# Patient Record
Sex: Male | Born: 1935 | Hispanic: No | Smoking: Never smoker
Health system: Southern US, Community
[De-identification: ages and names within clinical notes are randomized; demographics above are authoritative.]

---

## 2014-08-22 ENCOUNTER — Encounter (HOSPITAL_COMMUNITY): Payer: Self-pay | Admitting: Emergency Medicine

## 2014-08-22 ENCOUNTER — Emergency Department (HOSPITAL_COMMUNITY): Payer: Medicare Other

## 2014-08-22 ENCOUNTER — Emergency Department (HOSPITAL_COMMUNITY)
Admission: EM | Admit: 2014-08-22 | Discharge: 2014-08-22 | Disposition: A | Discharge status: Home / Self Care | Payer: Medicare Other | Attending: Emergency Medicine | Admitting: Emergency Medicine

## 2014-08-22 DIAGNOSIS — N401 Enlarged prostate with lower urinary tract symptoms: Secondary | ICD-10-CM | POA: Insufficient documentation

## 2014-08-22 DIAGNOSIS — R350 Frequency of micturition: Secondary | ICD-10-CM | POA: Diagnosis present

## 2014-08-22 DIAGNOSIS — R011 Cardiac murmur, unspecified: Secondary | ICD-10-CM | POA: Insufficient documentation

## 2014-08-22 DIAGNOSIS — R300 Dysuria: Secondary | ICD-10-CM

## 2014-08-22 DIAGNOSIS — R339 Retention of urine, unspecified: Secondary | ICD-10-CM

## 2014-08-22 DIAGNOSIS — N400 Enlarged prostate without lower urinary tract symptoms: Secondary | ICD-10-CM

## 2014-08-22 DIAGNOSIS — K4090 Unilateral inguinal hernia, without obstruction or gangrene, not specified as recurrent: Secondary | ICD-10-CM | POA: Insufficient documentation

## 2014-08-22 LAB — URINALYSIS, ROUTINE W REFLEX MICROSCOPIC
BILIRUBIN URINE: NEGATIVE
Glucose, UA: NEGATIVE mg/dL
KETONES UR: NEGATIVE mg/dL
Leukocytes, UA: NEGATIVE
NITRITE: NEGATIVE
Protein, ur: NEGATIVE mg/dL
Specific Gravity, Urine: 1.016 (ref 1.005–1.030)
Urobilinogen, UA: 1 mg/dL (ref 0.0–1.0)
pH: 6 (ref 5.0–8.0)

## 2014-08-22 LAB — CBC WITH DIFFERENTIAL/PLATELET
BASOS PCT: 0 % (ref 0–1)
Basophils Absolute: 0 10*3/uL (ref 0.0–0.1)
Eosinophils Absolute: 0.2 10*3/uL (ref 0.0–0.7)
Eosinophils Relative: 3 % (ref 0–5)
HEMATOCRIT: 41.7 % (ref 39.0–52.0)
HEMOGLOBIN: 14 g/dL (ref 13.0–17.0)
LYMPHS ABS: 0.9 10*3/uL (ref 0.7–4.0)
Lymphocytes Relative: 13 % (ref 12–46)
MCH: 28.8 pg (ref 26.0–34.0)
MCHC: 33.6 g/dL (ref 30.0–36.0)
MCV: 85.8 fL (ref 78.0–100.0)
MONOS PCT: 6 % (ref 3–12)
Monocytes Absolute: 0.5 10*3/uL (ref 0.1–1.0)
Neutro Abs: 5.4 10*3/uL (ref 1.7–7.7)
Neutrophils Relative %: 78 % — ABNORMAL HIGH (ref 43–77)
Platelets: 178 10*3/uL (ref 150–400)
RBC: 4.86 MIL/uL (ref 4.22–5.81)
RDW: 12.8 % (ref 11.5–15.5)
WBC: 7 10*3/uL (ref 4.0–10.5)

## 2014-08-22 LAB — BASIC METABOLIC PANEL
ANION GAP: 8 (ref 5–15)
BUN: 13 mg/dL (ref 6–20)
CHLORIDE: 105 mmol/L (ref 101–111)
CO2: 22 mmol/L (ref 22–32)
Calcium: 9 mg/dL (ref 8.9–10.3)
Creatinine, Ser: 1.48 mg/dL — ABNORMAL HIGH (ref 0.61–1.24)
GFR, EST AFRICAN AMERICAN: 49 mL/min — AB (ref >60)
GFR, EST NON AFRICAN AMERICAN: 42 mL/min — AB (ref >60)
GLUCOSE: 106 mg/dL — AB (ref 65–99)
Potassium: 4 mmol/L (ref 3.5–5.1)
Sodium: 135 mmol/L (ref 135–145)

## 2014-08-22 LAB — URINE MICROSCOPIC-ADD ON

## 2014-08-22 MED ORDER — CIPROFLOXACIN HCL 500 MG PO TABS: 500.0000 mg | ORAL_TABLET | Freq: Two times a day (BID) | ORAL | Status: AC

## 2014-08-22 MED ORDER — HYDROCODONE-ACETAMINOPHEN 5-325 MG PO TABS: 1.0000 | ORAL_TABLET | ORAL | Status: AC | PRN

## 2014-08-22 MED ORDER — TAMSULOSIN HCL 0.4 MG PO CAPS: 0.4000 mg | ORAL_CAPSULE | Freq: Every day | ORAL | Status: AC

## 2014-08-22 MED ORDER — CIPROFLOXACIN HCL 500 MG PO TABS
500.0000 mg | ORAL_TABLET | Freq: Once | ORAL | Status: AC
  Administered 2014-08-22: 500 mg via ORAL
  Filled 2014-08-22: qty 1

## 2014-08-22 NOTE — ED Notes (Signed)
Bladder scan- 

## 2014-08-22 NOTE — ED Notes (Signed)
Pt. Stated, about every 15 -20 minutes I have to go to bathroom.

## 2014-08-22 NOTE — ED Notes (Signed)
Interpretor phone used to educate pt on foley care and follow up care with urologist.  Pt verbalized understanding.

## 2014-08-22 NOTE — ED Provider Notes (Signed)
CSN: [id number]     Arrival date & time 08/22/14  1212 History   First MD Initiated Contact with Patient 08/22/14 1356     Chief Complaint  Patient presents with  . Urinary Frequency     (Consider location/radiation/quality/duration/timing/severity/associated sxs/prior Treatment) HPI Over 2 weeks the patient has been developing increasing difficulty and pain with urination. He reports frequency and inability to pass urine. Scribe severe burning. Also pain in the suprapubic and flank region. No fevers or chills. He reports 2 years ago he had some difficulty urinating and got some kind of medication that helps. He reports he was up all night with these symptoms. He reports he has not had fever or vomiting. He states he is otherwise been well. History reviewed. No pertinent past medical history. History reviewed. No pertinent past surgical history. No family history on file. History  Substance Use Topics  . Smoking status: Never Smoker   . Smokeless tobacco: Not on file  . Alcohol Use: No    Review of Systems  10 Systems reviewed and are negative for acute change except as noted in the HPI.   Allergies  Review of patient's allergies indicates not on file.  Home Medications   Prior to Admission medications   Medication Sig Start Date End Date Taking? Authorizing Provider  ciprofloxacin (CIPRO) 500 MG tablet Take 1 tablet (500 mg total) by mouth 2 (two) times daily. One po bid x 7 days 08/22/14   Arby Barrette, MD  HYDROcodone-acetaminophen (NORCO/VICODIN) 5-325 MG per tablet Take 1-2 tablets by mouth every 4 (four) hours as needed for moderate pain or severe pain. 08/22/14   Arby Barrette, MD  tamsulosin (FLOMAX) 0.4 MG CAPS capsule Take 1 capsule (0.4 mg total) by mouth daily. 08/22/14   Arby Barrette, MD   BP 176/75 mmHg  Pulse 101  Temp(Src) 98 F (36.7 C) (Oral)  Resp 17  Ht 5\' 5"  (1.651 m)  Wt 122 lb 4 oz (55.452 kg)  BMI 20.34 kg/m2  SpO2 96% Physical Exam   Constitutional: He is oriented to person, place, and time. He appears well-developed and well-nourished.  HENT:  Head: Normocephalic and atraumatic.  Eyes: EOM are normal. Pupils are equal, round, and reactive to light.  Neck: Neck supple.  Cardiovascular: Normal rate, regular rhythm and intact distal pulses.   Murmur (2/6 systolic ejection murmur) heard. Pulmonary/Chest: Effort normal.  No respiratory distress. Fine rales at the bases.  Abdominal: Soft. Bowel sounds are normal. He exhibits distension. There is tenderness.  Patient has a large inguinal hernia on the right. Positive suprapubic tenderness to palpation. Positive flank pain to tenderness. Scrotum is not erythematous or edematous. Penis is normal in appearance without discharge or erythema.  Musculoskeletal: Normal range of motion. He exhibits no edema.  Neurological: He is alert and oriented to person, place, and time. He has normal strength. Coordination normal. GCS eye subscore is 4. GCS verbal subscore is 5. GCS motor subscore is 6.  Skin: Skin is warm, dry and intact.  Psychiatric: He has a normal mood and affect.    ED Course  Procedures (including critical care time) Labs Review Labs Reviewed  URINALYSIS, ROUTINE W REFLEX MICROSCOPIC (NOT AT [facility]) - Abnormal; Notable for the following:    Hgb urine dipstick MODERATE (*)    All other components within normal limits  URINE MICROSCOPIC-ADD ON - Abnormal; Notable for the following:    Squamous Epithelial / LPF FEW (*)    Bacteria, UA FEW (*)  All other components within normal limits  BASIC METABOLIC PANEL - Abnormal; Notable for the following:    Glucose, Bld 106 (*)    Creatinine, Ser 1.48 (*)    GFR calc non Af Amer 42 (*)    GFR calc Af Amer 49 (*)    All other components within normal limits  CBC WITH DIFFERENTIAL/PLATELET - Abnormal; Notable for the following:    Neutrophils Relative % 78 (*)    All other components within normal limits  URINE CULTURE     Imaging Review Ct Renal Stone Study  08/22/2014   CLINICAL DATA:  Flank pain, difficulty urinating.  EXAM: CT ABDOMEN AND PELVIS WITHOUT CONTRAST  TECHNIQUE: Multidetector CT imaging of the abdomen and pelvis was performed following the standard protocol without IV contrast.  COMPARISON:  None.  FINDINGS: The liver, spleen, pancreas, gallbladder, adrenal glands are normal. There is mild hydronephrosis bilaterally without focal discrete obstructing stone in the collecting systems. Kidneys are normal. There is no perinephric stranding. There is atherosclerosis of the abdominal aorta without aneurysmal dilatation. There is no abdominal lymphadenopathy.  There is no small bowel obstruction or diverticulitis. The appendix is normal.  Fluid-filled bladder is normal. The prostate gland is enlarged measuring 5.1 x 4.3 x 7.6 cm. There are bilateral inguinal herniation of mesenteric fat and bowel loops without evidence of bowel obstruction or incarceration.  Images of the lung bases demonstrate scarring of the posterior right lung base. Degenerative joint changes of the spine are noted.  IMPRESSION: Bilateral mild hydronephrosis. This could be secondary to bladder outlet obstruction due to enlarged prostate.  Bilateral inguinal herniation of mesenteric fat and bowel loops without evidence of bowel obstruction or incarceration.   Electronically Signed   By: Sherian Rein M.D.   On: 08/22/2014 15:38     EKG Interpretation None      MDM   Final diagnoses:  Urinary retention  Dysuria  Prostatic hypertrophy   Patient has had dysuria and urgency with inability to empty his bladder. At this point he does have urinary retention and hematuria. CT does not show presence of kidney stone or other pathology other than prostatic hypertrophy and mild hydronephrosis. The patient will be discharged with a Foley catheter in place to continue bladder drainage and treated with ciprofloxacin. He is also given Flomax and  Vicodin for pain. Patient is aware of the need for urologic follow-up and the resources provided. He is otherwise well in appearance today, alert, clear mental status, no respiratory distress and nontoxic in appearance.    Arby Barrette, MD 08/22/14 1640

## 2014-08-22 NOTE — Discharge Instructions (Signed)
Acute Urinary Retention Acute urinary retention is the temporary inability to urinate. This is a common problem in older men. As men age their prostates become larger and block the flow of urine from the bladder. This is usually a problem that has come on gradually.  HOME CARE INSTRUCTIONS If you are sent home with a Foley catheter and a drainage system, you will need to discuss the best course of action with your health care provider. While the catheter is in, maintain a good intake of fluids. Keep the drainage bag emptied and lower than your catheter. This is so that contaminated urine will not flow back into your bladder, which could lead to a urinary tract infection. There are two main types of drainage bags. One is a large bag that usually is used at night. It has a good capacity that will allow you to sleep through the night without having to empty it. The second type is called a leg bag. It has a smaller capacity, so it needs to be emptied more frequently. However, the main advantage is that it can be attached by a leg strap and can go underneath your clothing, allowing you the freedom to move about or leave your home. Only take over-the-counter or prescription medicines for pain, discomfort, or fever as directed by your health care provider.  SEEK MEDICAL CARE IF:  You develop a low-grade fever.  You experience spasms or leakage of urine with the spasms. SEEK IMMEDIATE MEDICAL CARE IF:   You develop chills or fever.  Your catheter stops draining urine.  Your catheter falls out.  You start to develop increased bleeding that does not respond to rest and increased fluid intake. MAKE SURE YOU:  Understand these instructions.  Will watch your condition.  Will get help right away if you are not doing well or get worse. Document Released: 04/19/2000 Document Revised: 01/16/2013 Document Reviewed: 06/22/2012 [facility] Patient Information 2015 [place], [place]. This information is not  intended to replace advice given to you by your health care provider. Make sure you discuss any questions you have with your health care provider.  Dysuria Dysuria is the medical term for pain with urination. There are many causes for dysuria, but urinary tract infection is the most common. If a urinalysis was performed it can show that there is a urinary tract infection. A urine culture confirms that you or your child is sick. You will need to follow up with a healthcare provider because:  If a urine culture was done you will need to know the culture results and treatment recommendations.  If the urine culture was positive, you or your child will need to be put on antibiotics or know if the antibiotics prescribed are the right antibiotics for your urinary tract infection.  If the urine culture is negative (no urinary tract infection), then other causes may need to be explored or antibiotics need to be stopped. Today laboratory work may have been done and there does not seem to be an infection. If cultures were done they will take at least 24 to 48 hours to be completed. Today x-rays may have been taken and they read as normal. No cause can be found for the problems. The x-rays may be re-read by a radiologist and you will be contacted if additional findings are made. You or your child may have been put on medications to help with this problem until you can see your primary caregiver. If the problems get better, see your primary caregiver  if the problems return. If you were given antibiotics (medications which kill germs), take all of the mediations as directed for the full course of treatment.  If laboratory work was done, you need to find the results. Leave a telephone number where you can be reached. If this is not possible, make sure you find out how you are to get test results. HOME CARE INSTRUCTIONS   Drink lots of fluids. For adults, drink eight, 8 ounce glasses of clear juice or water a day.  For children, replace fluids as suggested by your caregiver.  Empty the bladder often. Avoid holding urine for long periods of time.  After a bowel movement, women should cleanse front to back, using each tissue only once.  Empty your bladder before and after sexual intercourse.  Take all the medicine given to you until it is gone. You may feel better in a few days, but TAKE ALL MEDICINE.  Avoid caffeine, tea, alcohol and carbonated beverages, because they tend to irritate the bladder.  In men, alcohol may irritate the prostate.  Only take over-the-counter or prescription medicines for pain, discomfort, or fever as directed by your caregiver.  If your caregiver has given you a follow-up appointment, it is very important to keep that appointment. Not keeping the appointment could result in a chronic or permanent injury, pain, and disability. If there is any problem keeping the appointment, you must call back to this facility for assistance. SEEK IMMEDIATE MEDICAL CARE IF:   Back pain develops.  A fever develops.  There is nausea (feeling sick to your stomach) or vomiting (throwing up).  Problems are no better with medications or are getting worse. MAKE SURE YOU:   Understand these instructions.  Will watch your condition.  Will get help right away if you are not doing well or get worse. Document Released: 10/10/2003 Document Revised: 04/05/2011 Document Reviewed: 08/17/2007 [facility] Patient Information 2015 [place], [place]. This information is not intended to replace advice given to you by your health care provider. Make sure you discuss any questions you have with your health care provider.

## 2014-08-23 ENCOUNTER — Encounter (HOSPITAL_COMMUNITY): Payer: Self-pay | Admitting: Emergency Medicine

## 2014-08-23 ENCOUNTER — Emergency Department (HOSPITAL_COMMUNITY)
Admission: EM | Admit: 2014-08-23 | Discharge: 2014-08-23 | Disposition: A | Discharge status: Home / Self Care | Payer: Medicare Other | Attending: Emergency Medicine | Admitting: Emergency Medicine

## 2014-08-23 DIAGNOSIS — Z960 Presence of urogenital implants: Secondary | ICD-10-CM

## 2014-08-23 DIAGNOSIS — R339 Retention of urine, unspecified: Secondary | ICD-10-CM | POA: Insufficient documentation

## 2014-08-23 DIAGNOSIS — Z978 Presence of other specified devices: Secondary | ICD-10-CM

## 2014-08-23 DIAGNOSIS — Z87898 Personal history of other specified conditions: Secondary | ICD-10-CM

## 2014-08-23 NOTE — ED Notes (Signed)
Pt misunderstood his instructions that he was to go to MD appointment with urologist on Monday. Pt thought he was to follow up in ED. Pt states he feels fine. Pt demonstrated to RN how to empty foley bag. Pt states he understands to follow up at Urologist on Monday for foley eval. Pt denies any pain at this time

## 2014-08-23 NOTE — ED Provider Notes (Signed)
History   Chief Complaint  Patient presents with  . Follow-up  . Urine Output    HPI  Kevin Scott is a 81 y.o. male with PMH as below who was seen in this ED yesterday for urinary retention and had foley placed with leg bag. Pt was deemed stable for outpt mgmt and set up with urology this coming Monday, 08/26/14. Pt thought he was supposed to return to ED to have foley taken out. He is without sxs currently. Denies f/c, N/V, abd pain. Says his foley is draining appropriately.  No other complaints at this time.  Past medical/surgical history, social history, medications, allergies and FH have been reviewed with patient and/or in documentation. Furthermore, if pt family or friend(s) present, additional historical information was obtained from them.  History reviewed. No pertinent past medical history. History reviewed. No pertinent past surgical history. No family history on file. History  Substance Use Topics  . Smoking status: Never Smoker   . Smokeless tobacco: Not on file  . Alcohol Use: No     Review of Systems Constitutional: - F/C, -fatigue.  HENT: - congestion, -rhinorrhea, -sore throat.   Eyes: - eye pain, -visual disturbance.  Respiratory: - cough, -SOB, -hemoptysis.   Cardiovascular: - CP, -palps.  Gastrointestinal: - N/V/D, -abd pain  Genitourinary: - flank pain, -dysuria, -frequency.  Musculoskeletal: - myalgia/arthritis, -joint swelling, -gait abnormality, -back pain, -neck pain/stiffness, -leg pain/swelling.  Skin: - rash/lesion.  Neurological: - focal weakness, -lightheadedness, -dizziness, -numbness, -HA.  All other systems reviewed and are negative.   Physical Exam  Physical Exam  ED Triage Vitals  Enc Vitals Group     BP 08/23/14 1220 192/74 mmHg     Pulse Rate 08/23/14 1220 90     Resp 08/23/14 1220 18     Temp 08/23/14 1220 97.8 F (36.6 C)     Temp Source 08/23/14 1220 Oral     SpO2 08/23/14 1220 96 %     Weight --      Height --    Head Cir --      Peak Flow --      Pain Score 08/23/14 1219 0     Pain Loc --      Pain Edu? --      Excl. in GC? --    Constitutional: Patient is well appearing and in no acute distress Head: Normocephalic and atraumatic.  Eyes: Extraocular motion intact, no scleral icterus Mouth: MMM, OP clear Neck: Supple without meningismus, mass, or overt JVD Respiratory: No respiratory distress. Normal WOB. No w/r/g. CV: RRR, no obvious murmurs.  Pulses +2 and symmetric. Euvolemic Abdomen: Soft, NT, ND, no r/g. No mass.  GU: foley catheter in place with leg bag and yellow urine in bag. MSK: Extremities are atraumatic without deformity, ROM intact Skin: Warm, dry, intact without rash Neuro: AAOx4, MAE 5/5 sym, no focal deficit noted   ED Course  Procedures  MDM: Grayer Sproles is a 81 y.o. male with H&P as above who p/w CC: wants foley catheter evaluated for removal. Epic records reviewed. Pt seen in ED yesterday and had foley placed. Pt was confused with his f/u. Pt has Urology f/u arranged for this coming Monday. Foley is working properly. Benign exam as above. Pt was educated on foley care and instructed to keep urology appt and not attempt any self removal of catheter. Stable for d/c.  Old records reviewed (if available). Labs and imaging reviewed personally by myself and considered in medical decision  making if ordered.  Clinical Impression: 1. H/O urinary retention   2. Foley catheter in place     Disposition: Discharge  Condition: Good  I have discussed the results, Dx and Tx plan with the pt(& family if present). He/she/they expressed understanding and agree(s) with the plan. Discharge instructions discussed at great length. Strict return precautions discussed and pt &/or family have verbalized understanding of the instructions. No further questions at time of discharge.    New Prescriptions   No medications on file    Follow Up: [facility] EMERGENCY  DEPARTMENT [address] [id number] mc [place] [address] [phone]  If symptoms worsen     Follow up with Urology this coming Monday as previously scheduled.   Pt seen in conjunction with Dr. Eber Hong, MD  Ames Dura, DO [facility] Emergency Medicine Resident - PGY-2      Ames Dura, MD 08/23/14 4034  Eber Hong, MD 08/23/14 [phone]

## 2014-08-23 NOTE — ED Provider Notes (Signed)
Pt had foley placed for retention - seen yesterday - no complaints - needs urology - has appointment, will redirect.  Soft and NT abd, clear heart and lungs, no f/c/n/v.  I saw and evaluated the patient, reviewed the resident's note and I agree with the findings and plan.   Final diagnoses:  H/O urinary retention  Foley catheter in place      Eber Hong, MD 08/23/14 2334

## 2014-08-23 NOTE — ED Notes (Signed)
Pt stated, Im here for follow up and he has a urine in his bag, came back to check it.

## 2014-08-23 NOTE — ED Notes (Signed)
Rn emptied pt's foley leg bag

## 2014-08-23 NOTE — ED Notes (Signed)
Pt placed on bp and pulse ox and here for a follow up.  Has a CATHETER WITH URINE BAG

## 2014-08-24 LAB — URINE CULTURE

## 2017-02-17 IMAGING — CT CT RENAL STONE PROTOCOL
2 of 4 series · 11 of 46 positions shown, 12 images · non-contrast
Comparison: None.

CLINICAL DATA: Flank pain, difficulty urinating.

EXAM:
CT ABDOMEN AND PELVIS WITHOUT CONTRAST
TECHNIQUE: Multidetector CT imaging of the abdomen and pelvis was performed
following the standard protocol without IV contrast.

[Series 401: stone study, idose (2) · axial · 0.77mm/px · z∈[+212,+597]mm · 8 of 93 slices shown, 9 images]
[im 8/93  soft-tissue]
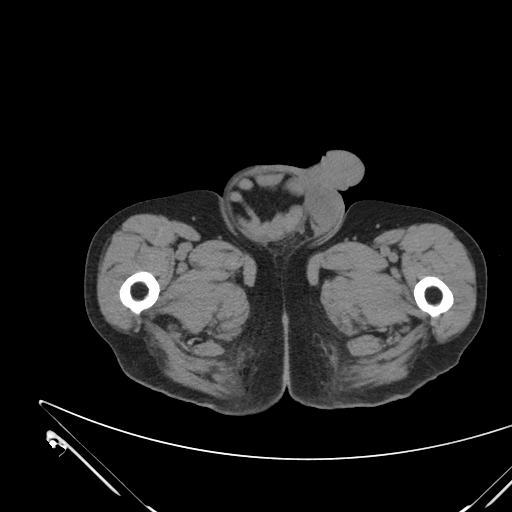
[im 8/93  bone]
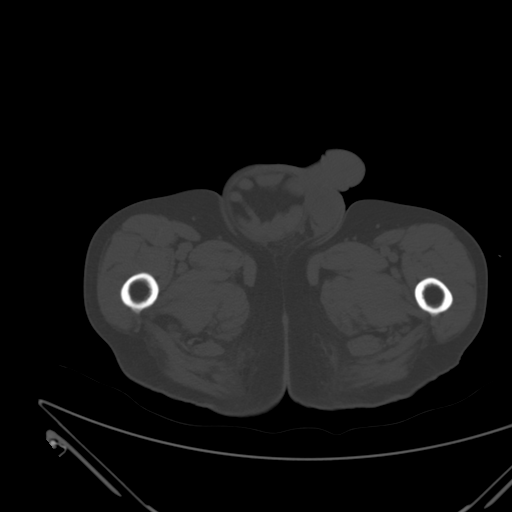
[im 19/93  soft-tissue]
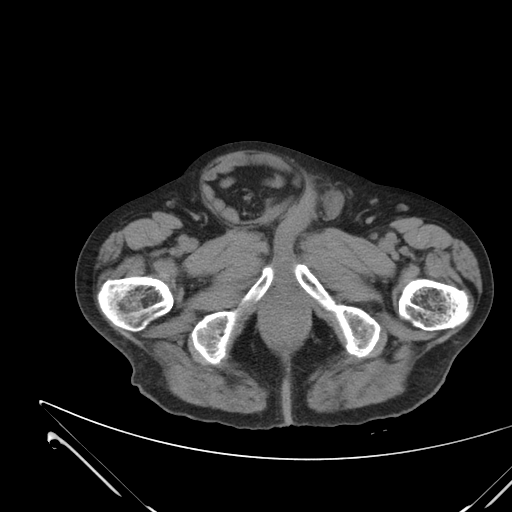
[im 30/93  soft-tissue]
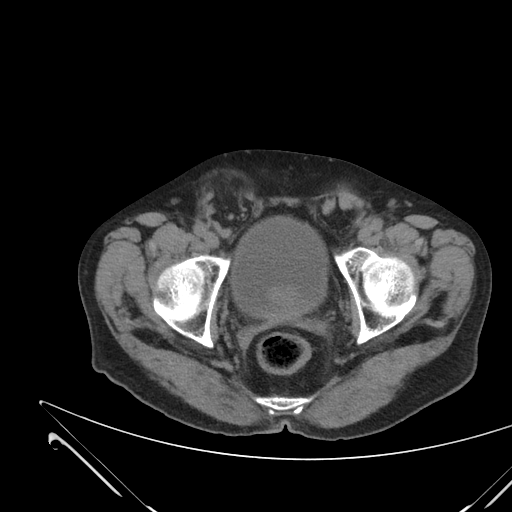
[im 41/93  soft-tissue]
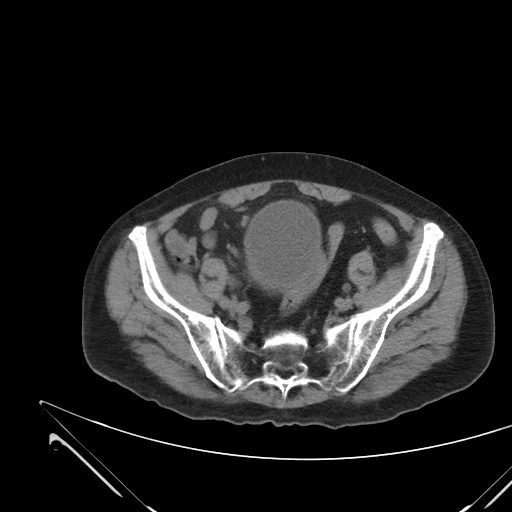
[im 52/93  soft-tissue]
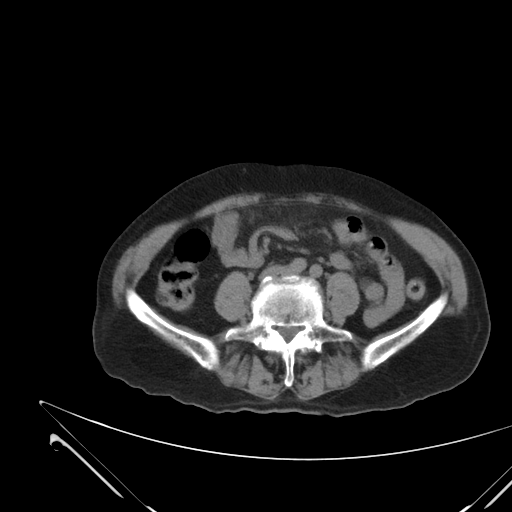
[im 63/93  soft-tissue]
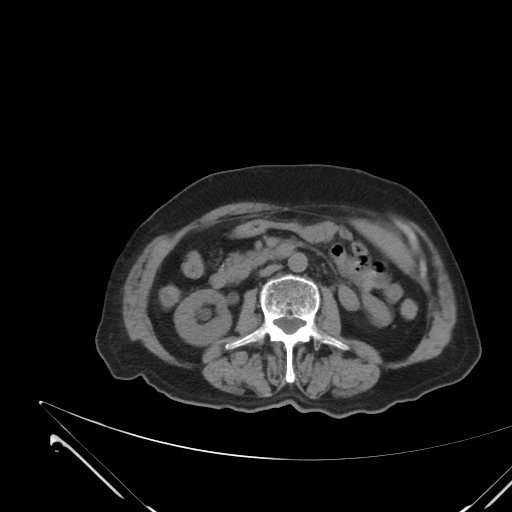
[im 74/93  soft-tissue]
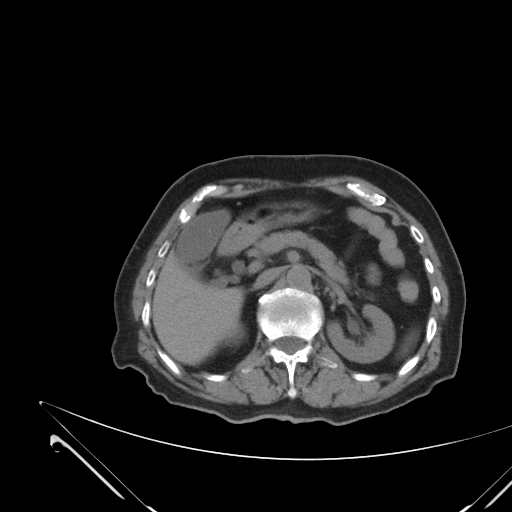
[im 85/93  soft-tissue]
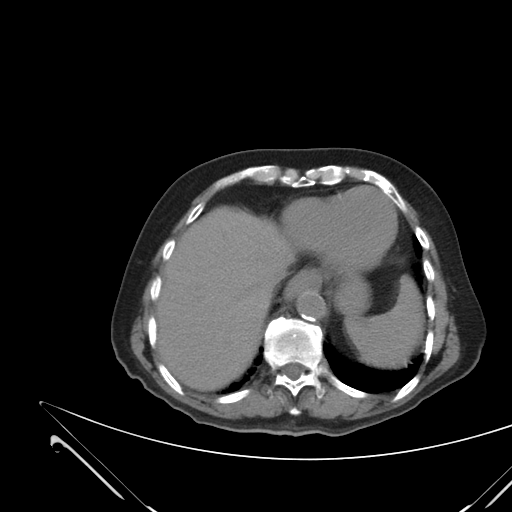

[Series 403: coronals, idose (2) · coronal · 0.45mm/px · 3 of 149 slices shown]
[im 50/149  soft-tissue]
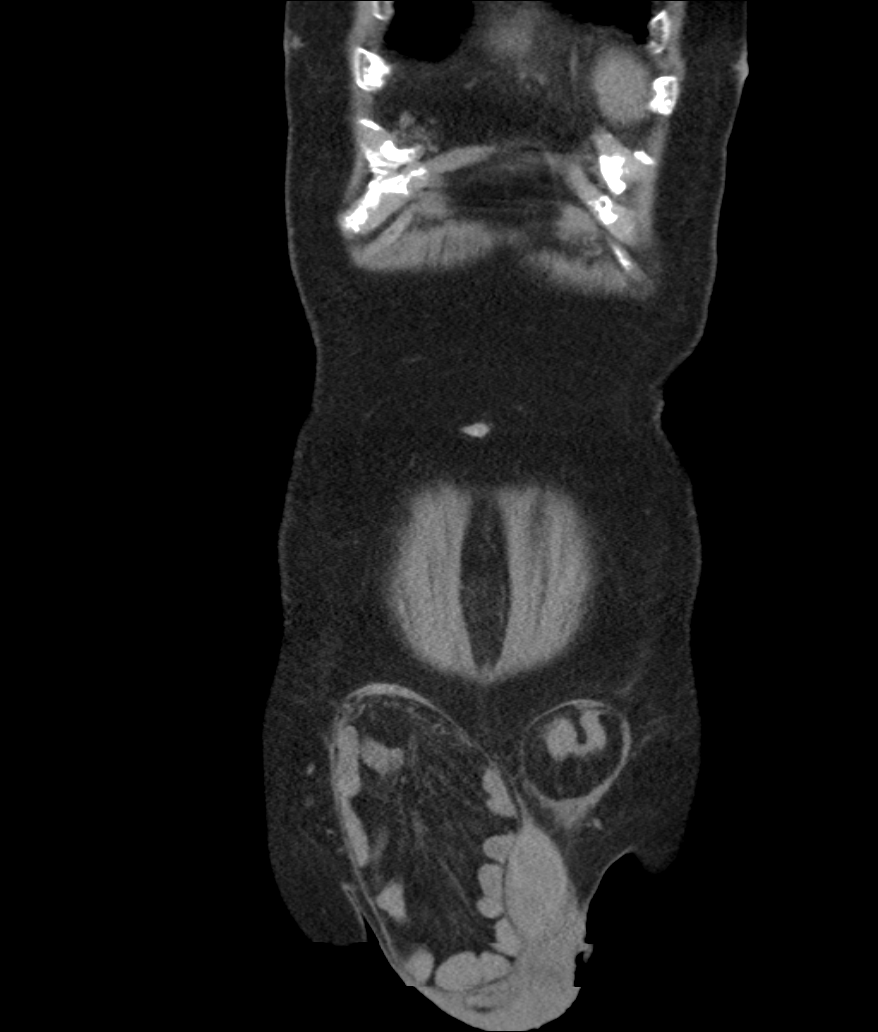
[im 66/149  soft-tissue]
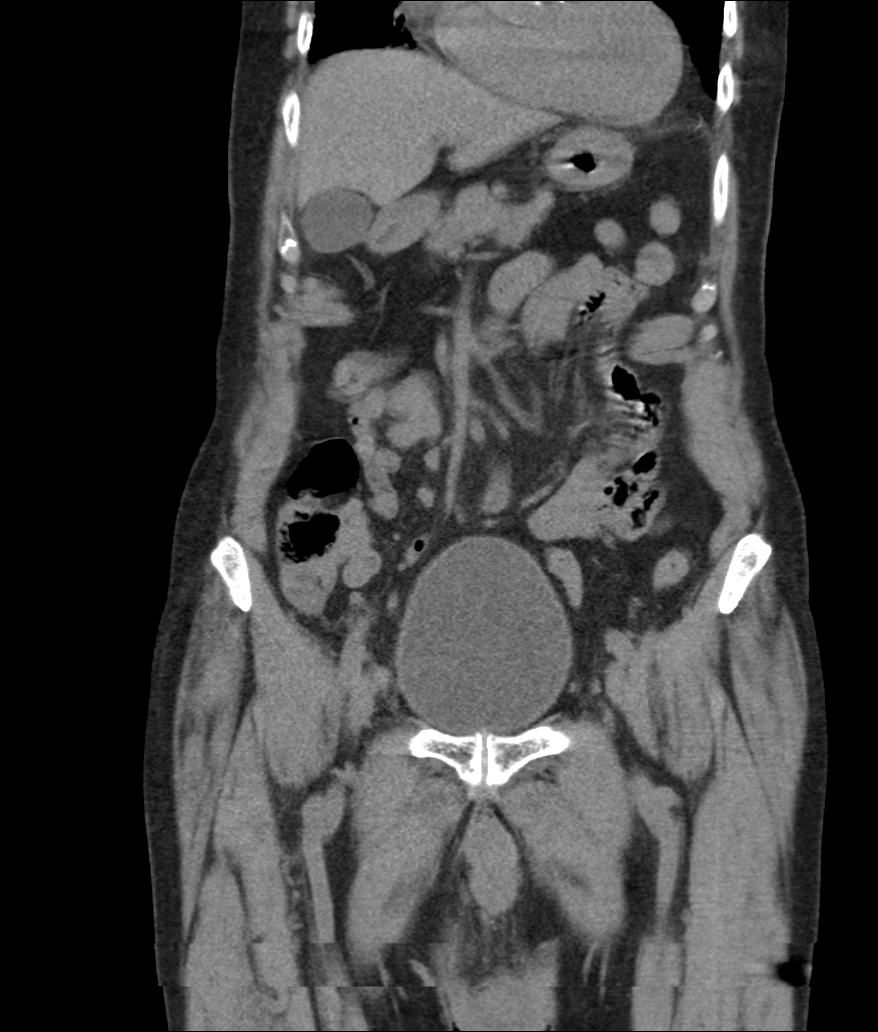
[im 83/149  soft-tissue]
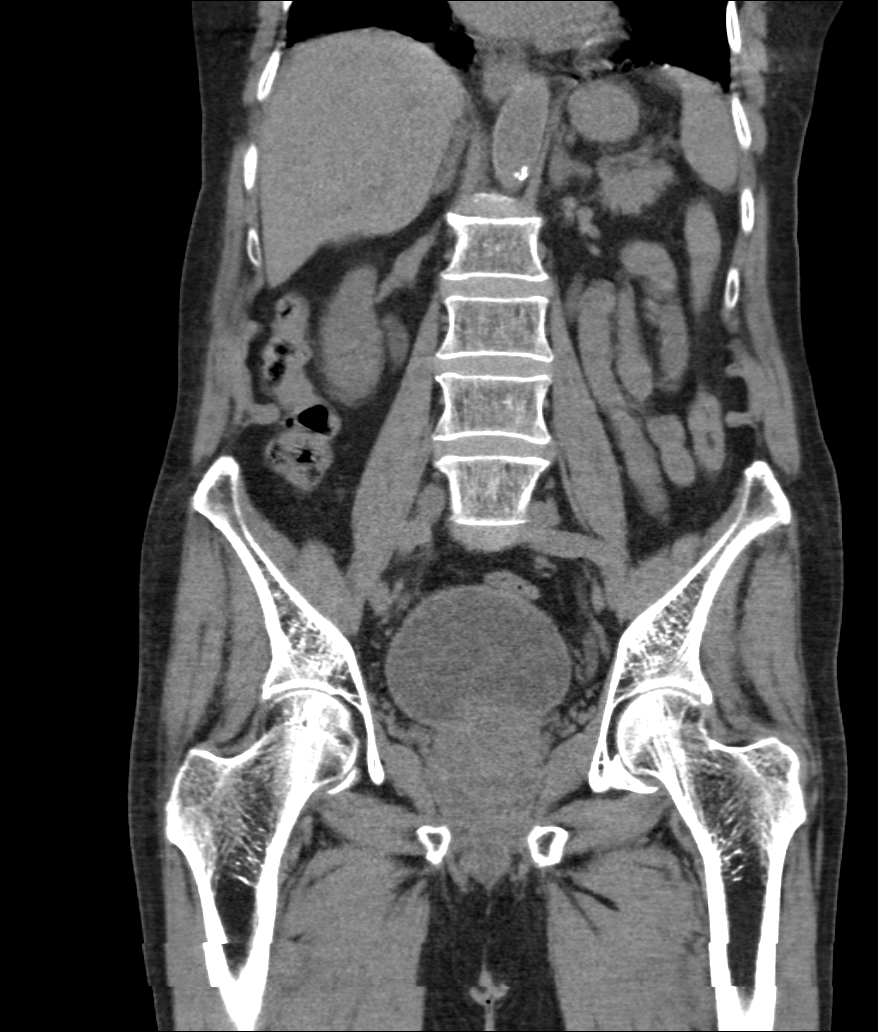

[11 of 46 positions shown; findings below may reference images not displayed]

FINDINGS: The liver, spleen, pancreas, gallbladder, adrenal glands are normal.
There is mild hydronephrosis bilaterally without focal discrete
obstructing stone in the collecting systems. Kidneys are normal.
There is no perinephric stranding. There is atherosclerosis of the
abdominal aorta without aneurysmal dilatation. There is no abdominal
lymphadenopathy.

There is no small bowel obstruction or diverticulitis. The appendix
is normal.

Fluid-filled bladder is normal. The prostate gland is enlarged
measuring 5.1 x 4.3 x 7.6 cm. There are bilateral inguinal
herniation of mesenteric fat and bowel loops without evidence of
bowel obstruction or incarceration.

Images of the lung bases demonstrate scarring of the posterior right
lung base. Degenerative joint changes of the spine are noted.
IMPRESSION: Bilateral mild hydronephrosis. This could be secondary to bladder
outlet obstruction due to enlarged prostate.

Bilateral inguinal herniation of mesenteric fat and bowel loops
without evidence of bowel obstruction or incarceration.
